# Patient Record
Sex: Male | Born: 1950 | Race: White | Hispanic: No | Marital: Married
Health system: Southern US, Community
[De-identification: ages and names within clinical notes are randomized; demographics above are authoritative.]

---

## 2006-08-27 ENCOUNTER — Ambulatory Visit: Payer: Self-pay | Admitting: Urology

## 2007-10-08 ENCOUNTER — Ambulatory Visit: Payer: Self-pay | Admitting: Internal Medicine

## 2008-02-17 ENCOUNTER — Ambulatory Visit: Payer: Self-pay | Admitting: Internal Medicine

## 2008-04-14 ENCOUNTER — Ambulatory Visit: Payer: Self-pay | Admitting: Psychiatry

## 2009-10-19 ENCOUNTER — Encounter: Admission: RE | Admit: 2009-10-19 | Discharge: 2009-10-19 | Payer: Self-pay | Admitting: Diagnostic Neuroimaging

## 2009-10-25 ENCOUNTER — Encounter: Admission: RE | Admit: 2009-10-25 | Discharge: 2009-11-23 | Payer: Self-pay | Admitting: Diagnostic Neuroimaging

## 2010-08-27 ENCOUNTER — Encounter: Payer: Self-pay | Admitting: Diagnostic Neuroimaging

## 2012-02-21 ENCOUNTER — Emergency Department: Payer: Self-pay | Admitting: Emergency Medicine

## 2012-02-21 LAB — CBC
HCT: 43 %
HGB: 14.1 g/dL
MCH: 30.9 pg
MCHC: 32.7 g/dL
MCV: 94 fL
Platelet: 143 x10 3/mm 3 — ABNORMAL LOW
RBC: 4.56 x10 6/mm 3
RDW: 17.1 % — ABNORMAL HIGH
WBC: 6.2 x10 3/mm 3

## 2012-02-21 LAB — URINALYSIS, COMPLETE
Bacteria: NONE SEEN
Bilirubin,UR: NEGATIVE
Blood: NEGATIVE
Glucose,UR: NEGATIVE mg/dL
Ketone: NEGATIVE
Leukocyte Esterase: NEGATIVE
Nitrite: NEGATIVE
Ph: 5
Protein: NEGATIVE
RBC,UR: 1 /HPF
Specific Gravity: 1.011
Squamous Epithelial: NONE SEEN
WBC UR: NONE SEEN /HPF

## 2012-02-21 LAB — COMPREHENSIVE METABOLIC PANEL
Albumin: 3.9 g/dL (ref 3.4–5.0)
Alkaline Phosphatase: 71 U/L (ref 50–136)
BUN: 33 mg/dL — ABNORMAL HIGH (ref 7–18)
Glucose: 95 mg/dL (ref 65–99)
SGOT(AST): 23 U/L (ref 15–37)
SGPT (ALT): 12 U/L
Total Protein: 8.2 g/dL (ref 6.4–8.2)

## 2012-02-21 LAB — CK TOTAL AND CKMB (NOT AT ARMC)
CK, Total: 134 U/L
CK-MB: 2.7 ng/mL

## 2012-02-22 ENCOUNTER — Emergency Department: Payer: Self-pay | Admitting: *Deleted

## 2012-02-22 LAB — COMPREHENSIVE METABOLIC PANEL
Albumin: 3.3 g/dL — ABNORMAL LOW (ref 3.4–5.0)
Anion Gap: 4 — ABNORMAL LOW (ref 7–16)
Calcium, Total: 8.5 mg/dL (ref 8.5–10.1)
Creatinine: 1.95 mg/dL — ABNORMAL HIGH (ref 0.60–1.30)
EGFR (African American): 42 — ABNORMAL LOW
EGFR (Non-African Amer.): 36 — ABNORMAL LOW
Glucose: 84 mg/dL (ref 65–99)

## 2012-02-22 LAB — CBC
HGB: 13.2 g/dL (ref 13.0–18.0)
RBC: 4.29 10*6/uL — ABNORMAL LOW (ref 4.40–5.90)
WBC: 5.8 10*3/uL (ref 3.8–10.6)

## 2012-12-23 ENCOUNTER — Ambulatory Visit: Payer: Self-pay | Admitting: Family Medicine

## 2012-12-23 LAB — CBC
MCH: 30.4 pg (ref 26.0–34.0)
MCHC: 33.2 g/dL (ref 32.0–36.0)
MCV: 92 fL (ref 80–100)
Platelet: 173 10*3/uL (ref 150–440)

## 2012-12-23 LAB — URINALYSIS, COMPLETE
Bacteria: NEGATIVE
Glucose,UR: NEGATIVE mg/dL (ref 0–75)
Leukocyte Esterase: NEGATIVE
Nitrite: NEGATIVE
Specific Gravity: 1.02 (ref 1.003–1.030)

## 2012-12-23 LAB — COMPREHENSIVE METABOLIC PANEL
Albumin: 3.4 g/dL (ref 3.4–5.0)
BUN: 16 mg/dL (ref 7–18)
Bilirubin,Total: 0.2 mg/dL (ref 0.2–1.0)
Chloride: 98 mmol/L (ref 98–107)
Creatinine: 1.94 mg/dL — ABNORMAL HIGH (ref 0.60–1.30)
EGFR (African American): 42 — ABNORMAL LOW
EGFR (Non-African Amer.): 36 — ABNORMAL LOW
Glucose: 90 mg/dL (ref 65–99)
SGOT(AST): 16 U/L (ref 15–37)

## 2012-12-23 LAB — DRUG SCREEN, URINE
Barbiturates, Ur Screen: NEGATIVE (ref ?–200)
MDMA (Ecstasy)Ur Screen: NEGATIVE (ref ?–500)
Opiate, Ur Screen: NEGATIVE (ref ?–300)
Phencyclidine (PCP) Ur S: NEGATIVE (ref ?–25)

## 2012-12-23 LAB — VALPROIC ACID LEVEL: Valproic Acid: 96 ug/mL

## 2012-12-23 LAB — TSH: Thyroid Stimulating Horm: 3.41 u[IU]/mL

## 2013-01-26 ENCOUNTER — Ambulatory Visit: Payer: Self-pay

## 2013-07-24 ENCOUNTER — Inpatient Hospital Stay: Payer: Self-pay | Admitting: Specialist

## 2013-07-24 LAB — CBC
HCT: 39.6 % — ABNORMAL LOW (ref 40.0–52.0)
MCH: 30.8 pg (ref 26.0–34.0)
MCHC: 34.4 g/dL (ref 32.0–36.0)
MCV: 90 fL (ref 80–100)
Platelet: 186 10*3/uL (ref 150–440)
RBC: 4.42 10*6/uL (ref 4.40–5.90)

## 2013-07-24 LAB — URINALYSIS, COMPLETE
Bilirubin,UR: NEGATIVE
Blood: NEGATIVE
Glucose,UR: NEGATIVE mg/dL (ref 0–75)
Nitrite: NEGATIVE
Ph: 6 (ref 4.5–8.0)
RBC,UR: <1 /HPF (ref 0–5)
Specific Gravity: 1.01 (ref 1.003–1.030)
WBC UR: <1 /HPF (ref 0–5)

## 2013-07-24 LAB — SALICYLATE LEVEL: Salicylates, Serum: 12.1 mg/dL — ABNORMAL HIGH

## 2013-07-24 LAB — DRUG SCREEN, URINE
Amphetamines, Ur Screen: NEGATIVE (ref ?–1000)
Barbiturates, Ur Screen: NEGATIVE (ref ?–200)
Cannabinoid 50 Ng, Ur ~~LOC~~: NEGATIVE (ref ?–50)
Opiate, Ur Screen: NEGATIVE (ref ?–300)
Phencyclidine (PCP) Ur S: NEGATIVE (ref ?–25)
Tricyclic, Ur Screen: NEGATIVE (ref ?–1000)

## 2013-07-24 LAB — COMPREHENSIVE METABOLIC PANEL
Alkaline Phosphatase: 74 U/L
BUN: 20 mg/dL — ABNORMAL HIGH (ref 7–18)
Bilirubin,Total: 0.3 mg/dL (ref 0.2–1.0)
Chloride: 93 mmol/L — ABNORMAL LOW (ref 98–107)
Co2: 26 mmol/L (ref 21–32)
EGFR (Non-African Amer.): 48 — ABNORMAL LOW
SGOT(AST): 23 U/L (ref 15–37)

## 2013-07-24 LAB — ACETAMINOPHEN LEVEL: Acetaminophen: <2 ug/mL

## 2013-07-25 ENCOUNTER — Inpatient Hospital Stay: Payer: Self-pay | Admitting: Psychiatry

## 2013-07-25 LAB — TROPONIN I: Troponin-I: 0.02 ng/mL

## 2013-07-25 LAB — CBC WITH DIFFERENTIAL/PLATELET
Basophil %: 0.5 %
Eosinophil #: 0.2 10*3/uL (ref 0.0–0.7)
Eosinophil %: 2.4 %
HCT: 36.9 % — ABNORMAL LOW (ref 40.0–52.0)
MCH: 29.6 pg (ref 26.0–34.0)
MCHC: 33.1 g/dL (ref 32.0–36.0)
MCV: 89 fL (ref 80–100)
Monocyte %: 14.6 %
Neutrophil %: 49.3 %
RBC: 4.12 10*6/uL — ABNORMAL LOW (ref 4.40–5.90)

## 2013-07-25 LAB — BASIC METABOLIC PANEL
Anion Gap: 4 — ABNORMAL LOW (ref 7–16)
Calcium, Total: 9 mg/dL (ref 8.5–10.1)
Co2: 26 mmol/L (ref 21–32)
Potassium: 4.1 mmol/L (ref 3.5–5.1)
Sodium: 133 mmol/L — ABNORMAL LOW (ref 136–145)

## 2013-09-13 ENCOUNTER — Ambulatory Visit: Payer: Self-pay | Admitting: Physician Assistant

## 2013-12-22 ENCOUNTER — Ambulatory Visit: Payer: Self-pay | Admitting: Internal Medicine

## 2014-09-06 IMAGING — CT CT HEAD WITHOUT CONTRAST
3 of 4 series · 17 of 30 positions shown, 19 images · non-contrast
Comparison: Head CT 02/22/2012.

CLINICAL DATA: Agitation and confusion.

EXAM:
CT HEAD WITHOUT CONTRAST
TECHNIQUE: Contiguous axial images were obtained from the base of the skull
through the vertex without intravenous contrast.

[Series 2: head wo · axial · 0.45mm/px · z∈[-163,-53]mm · 6 of 32 slices shown, 8 images]
[im 5/32  brain]
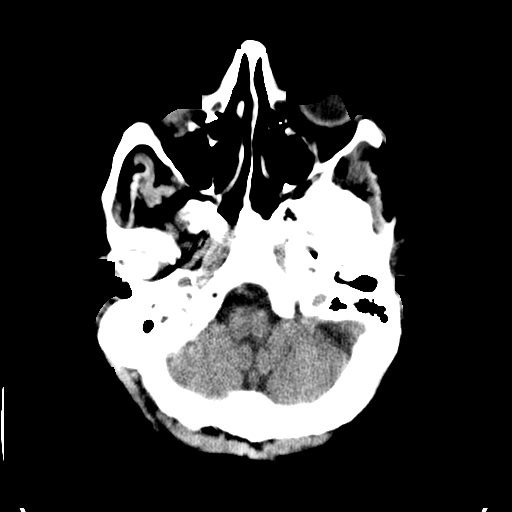
[im 5/32  bone]
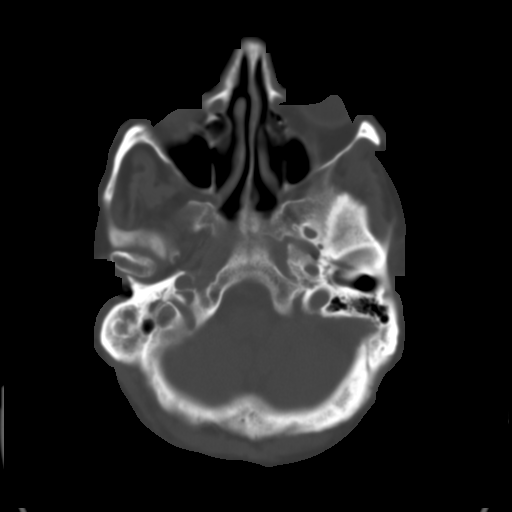
[im 9/32  brain]
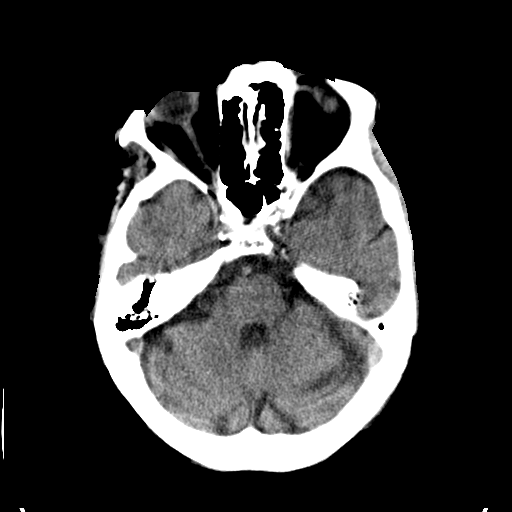
[im 14/32  brain]
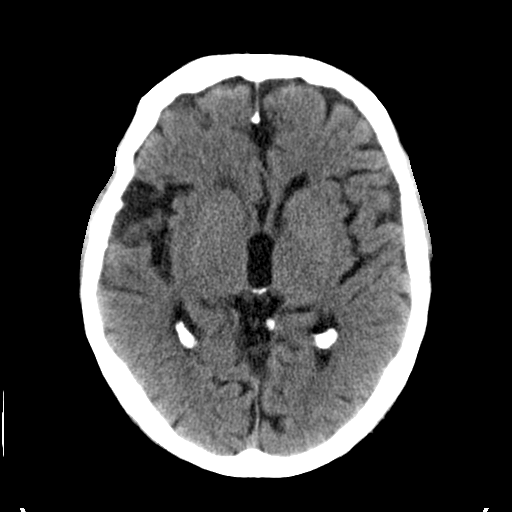
[im 18/32  brain]
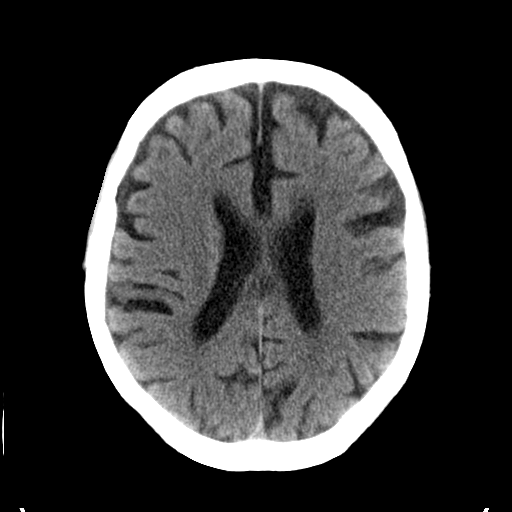
[im 23/32  brain]
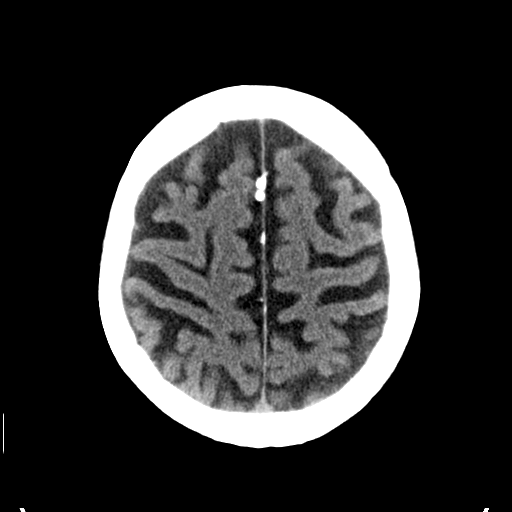
[im 23/32  bone]
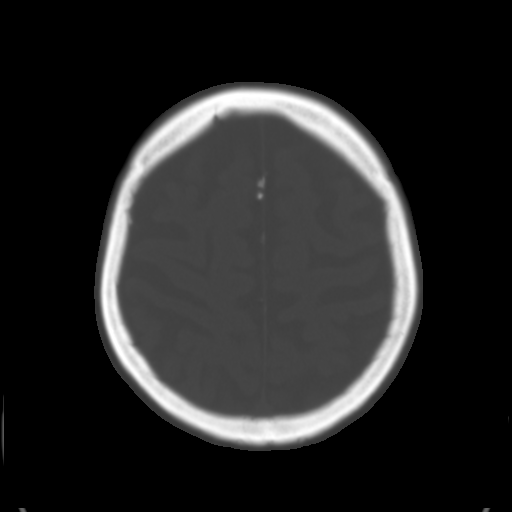
[im 27/32  brain]
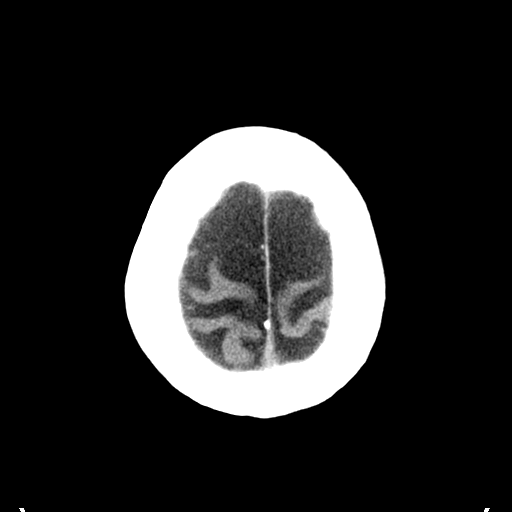

[Series 3: head bone · axial · 0.43mm/px · z∈[-163,-48]mm · 6 of 33 slices shown]
[im 5/33  bone]
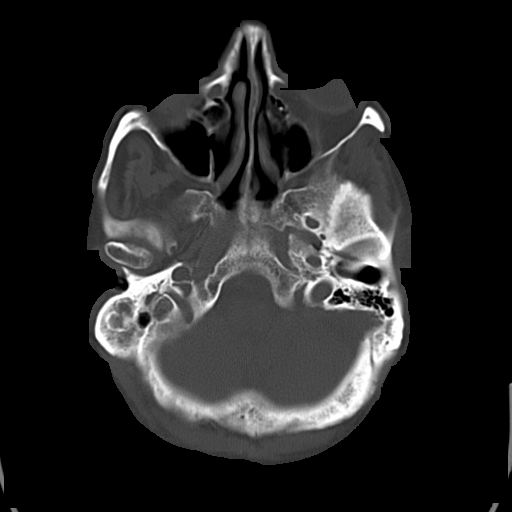
[im 10/33  bone]
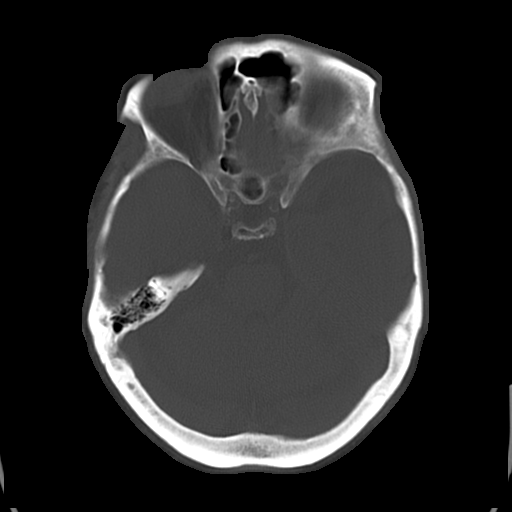
[im 14/33  bone]
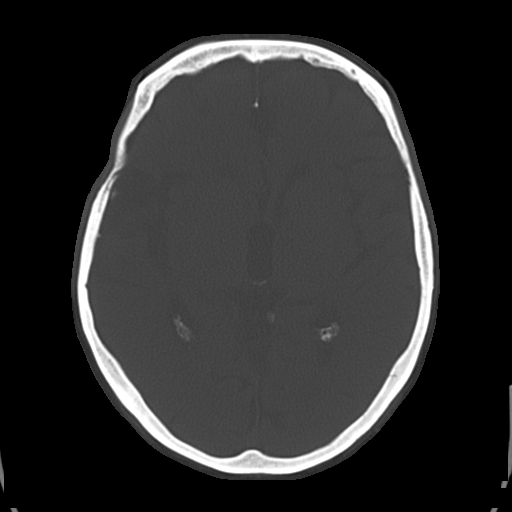
[im 19/33  bone]
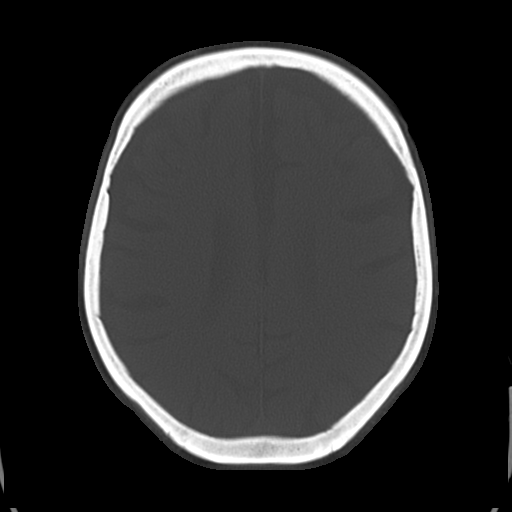
[im 23/33  bone]
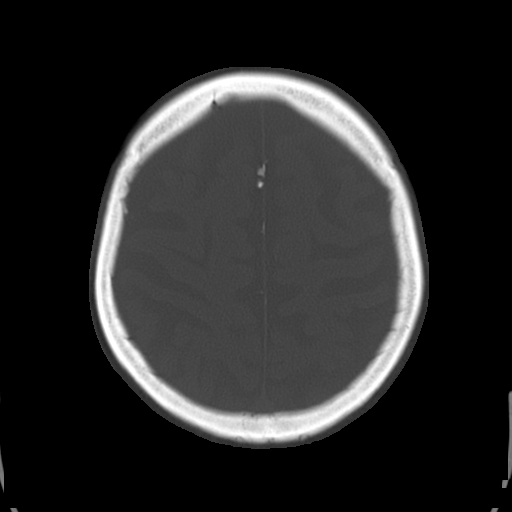
[im 28/33  bone]
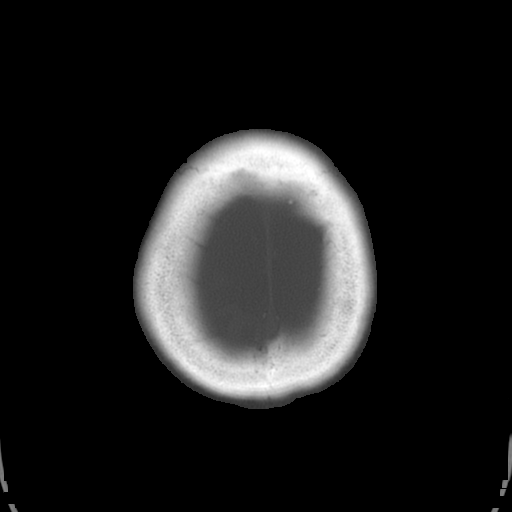

[Series 4: head wo 2 · axial · 0.45mm/px · z∈[-110,-23]mm · 5 of 28 slices shown]
[im 5/28  brain]
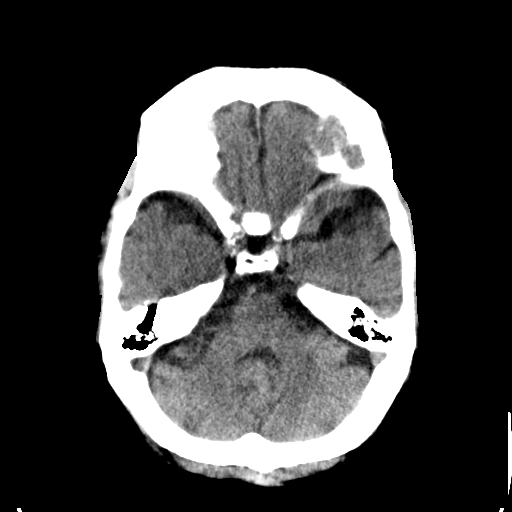
[im 10/28  brain]
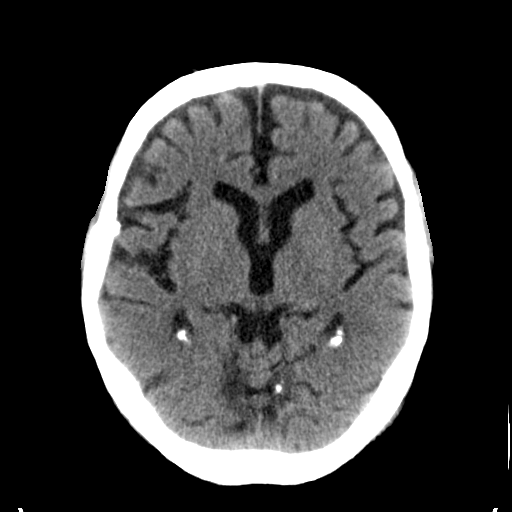
[im 14/28  brain]
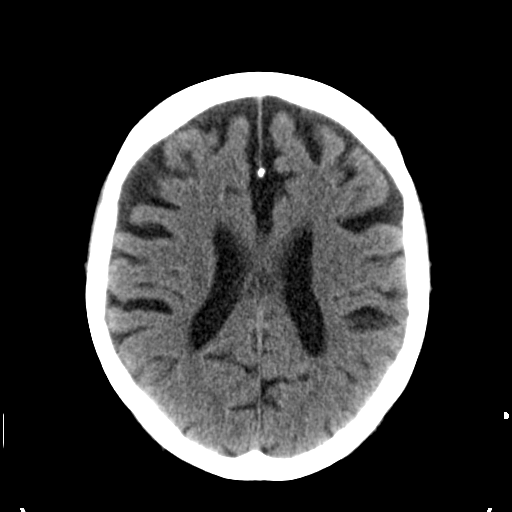
[im 19/28  brain]
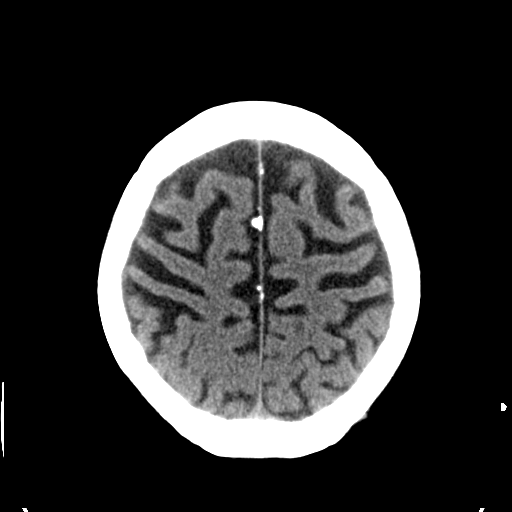
[im 23/28  brain]
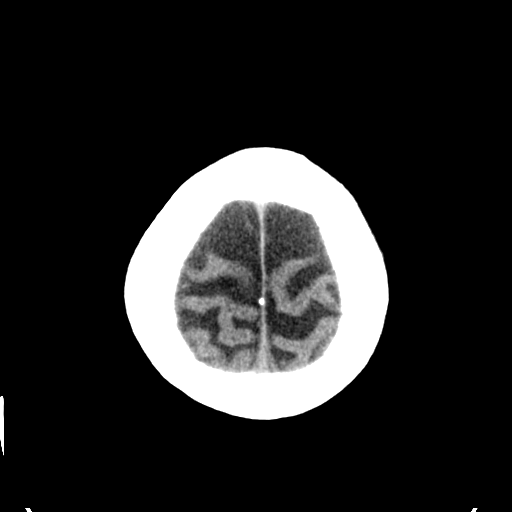

[17 of 30 positions shown; findings below may reference images not displayed]

FINDINGS: Mild cerebral and cerebellar atrophy. No acute intracranial
abnormalities. Specifically, no evidence of acute intracranial
hemorrhage, no definite findings of acute/subacute cerebral
ischemia, no mass, mass effect, hydrocephalus or abnormal intra or
extra-axial fluid collections. Visualized paranasal sinuses and
mastoids are well pneumatized. No acute displaced skull fractures
are identified.
IMPRESSION: 1. No acute intracranial abnormalities.
2. Mild cerebral and cerebellar atrophy.

## 2014-11-26 NOTE — Consult Note (Signed)
Brief Consult Note: Diagnosis: Bipolar disorder mania.   Patient was seen by consultant.   Consult note dictated.   Recommend further assessment or treatment.   Orders entered.   Comments: Please transfer to psychiatry when medically stable.  Electronic Signatures: Kristine LineaPucilowska, Odalys Win (MD)  (Signed 20-Dec-14 11:45)  Authored: Brief Consult Note   Last Updated: 20-Dec-14 11:45 by Kristine LineaPucilowska, Redell Bhandari (MD)
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
## 2014-11-26 NOTE — Consult Note (Signed)
Brief Consult Note: Diagnosis: Bipolar disorder manic with psychosis, AMS from hyponatremia.   Patient was seen by consultant.   Consult note dictated.   Recommend further assessment or treatment.   Orders entered.   Comments: Mr. Gerald Sims has a h/o bipolar [facility] maintained on depakote 100 mg at night. He has been compliant with medications as indicated by therapeutic VPA level. He became increasing ly bizarre and got into an argument with his psychiatrist. In ER Na level 123, likely from Depakote. He is not suicidal or homicidal.  PLAN: 1. We d/c depakote and start Zyprexa zydis 5 mf bid for bipolar mania. Will increase gradually. He takes trazodone for sleep;.  2. Per Dr. Tedra SenegalWiesman he is also taking Nexium 40 mg, Synthroid 75 mcg and Lisinopril 10 mg.   3. Psychoatry will follow up.   2.  Electronic Signatures: Kristine LineaPucilowska, Jolanta (MD)  (Signed 19-Dec-14 15:24)  Authored: Brief Consult Note   Last Updated: 19-Dec-14 15:24 by Kristine LineaPucilowska, Jolanta (MD)

## 2014-11-26 NOTE — Discharge Summary (Signed)
PATIENT NAME:  Gerald Sims, Gerald Sims MR#:  [id number] DATE OF BIRTH:  October 03, 1950  DATE OF ADMISSION:  07/24/2013 DATE OF DISCHARGE:  07/25/2013  For a detailed note, please see the history and physical done on admission by Dr. Winona LegatoVaickute.   DIAGNOSES AT DISCHARGE: 1.  Altered mental status and agitation secondary to underlying bipolar disorder, complicated with mild hyponatremia.  2.  Hyponatremia, likely hypovolemic hyponatremia.  3.  Hypertension.  4.  Benign prostatic hypertrophy.  5.  Bipolar disorder.   DISPOSITION: The patient is being discharged to Behavioral Medicine.   ACTIVITY: As tolerated.   FOLLOWUP: With patient's primary care physician, Dr. Tonia GhentAbby Bender in the next 1 to 2 weeks [facility]   DISCHARGE MEDICATIONS: Depakote extended release 500 mg 2 tabs at bedtime, risperidone 2 mg daily, lisinopril 10 mg daily, Flomax 0.4 mg daily, trazodone 150 mg at bedtime, metoprolol tartrate 50 mg b.i.d., bactroban ointment to be applied t.i.d. as needed.   CONSULTANTS DURING THE HOSPITAL COURSE: Dr. Jennet MaduroPucilowska from psychiatry.   PERTINENT STUDIES DONE DURING THE HOSPITAL COURSE: A CT scan of the head done without contrast on admission, showing no acute intracranial abnormalities, mild cerebral and cerebellar atrophy.   HOSPITAL COURSE: This is a 64 year old male with medical problems as mentioned above, presented to the hospital due to altered mental status and agitation.   1.  Altered mental status/agitation. The most likely cause of this was probably related to his underlying bipolar disorder, and also complicated with mild hyponatremia, as the patient's sodium was 123 on admission. The patient was gently hydrated with IV fluids to help with his hyponatremia. His sodium has since then significantly improved, has normalized, and his mental status is currently at baseline. The patient was already involuntarily committed by psychiatry on admission and  [facility] as he is medically stable.   2.  Hyponatremia. This was acute, mild, hypotonic, hypovolemic hyponatremia. The patient was apparently on two diuretics prior to coming into the hospital, including HCTZ and chlorthalidone. I am discontinuing those for now. He has been hydrated with IV fluids and his sodium has since then improved.   3.  Hypertension. The patient remained hemodynamically stable. As mentioned, the patient was on antihypertensives including chlorthalidone and HCTZ, both of which could contribute to his hyponatremia. I am therefore discontinuing them. At this point, the patient will just continue his metoprolol, as stated.   4.  Benign prostatic hypertrophy. The patient had no evidence of urinary obstruction. The patient will continue his Flomax as stated.    5. Acute renal failure. This was likely secondary to dehydration and poor p.o. intake. The patient's creatinine admission was 1.5. It has now normalized with some IV fluids down to 1.1.  CODE STATUS: The patient is a full code.   DISPOSITION: He is being discharged to Behavioral Medicine.   TIME SPENT: 40 minutes.     ____________________________ Rolly PancakeVivek J. Cherlynn KaiserSainani, MD vjs:cg D: 07/25/2013 14:32:00 ET T: 07/26/2013 00:47:40 ET JOB#: [id number]  cc: Rolly PancakeVivek J. Cherlynn KaiserSainani, MD, <Dictator> Abby D. Hessie DienerBender MD Houston SirenVIVEK J Amauria Younts MD ELECTRONICALLY SIGNED 07/30/2013 13:41
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
## 2014-12-14 NOTE — H&P (Signed)
PATIENT NAME:  Gerald Sims, Gerald Sims [id number] OF BIRTH:  12-09-1950 OF ADMISSION:  12/20/2014OF ASSESSMENT:  07/26/2013 PHYSICIAN:  Hilda LiasVivek Sainani, M.D.PHYSICIAN:  Jolanta B. Jennet MaduroPucilowska, M.D. DATA: Gerald Sims is a 64 year old male with history of psychotic disorder and mood instability.  COMPLAINT: "My wife is coming to take me home."  OF PRESENT ILLNESS: Gerald Sims has been maintained on Depakote by Dr. Barnett AbuWiseman, his primary psychiatrist. The patient showed up at his office on 12/19 very disorganized, restless, confused and tried to convert him to Christianity. He was sent to the hospital. He was briefly hospitalized on medical floor for hyponatremia, likely a contributing factor to confusion. Per his wife?s report, the patient has been behaving strangely in the past several weeks. He was paranoid and easily frightened by noises in the apartment building, putting tape over the door to prevent drafts. Wife did not feel that this was a problem. She also felt that he was more than usually controlling of her behavior. She was not surprised that he was sent to the Emergency Room. The patient, in the past, was treated with an antipsychotic, but recently he has been maintained on Depakote only. In the Emergency Room, he was slightly confused, but at the same time, was able to provide many details of his story. He remembered out names. He was strange, but not delirious. There is no history of alcohol or illicit substance use.  the patient was on medical floor, we discontinue Depakote and initiated Zyprexa zydis. He refused medication at times and was transferred to psychiatry.  PSYCHIATRIC HISTORY: Reportedly, he has never been hospitalized. Dr. Barnett AbuWiseman has been taking care of him for several years, as above. At one point, he was on Risperdal, but this was discontinued as the patient was doing very, very well. There were no suicide attempts that we know of.   MEDICAL HISTORY: Hypertension, hypothyroidism, GERD,  prostate hypertrophy.  No known drug allergies.  ON ADMISSION: Zyprexa zydis 5 mg twice daily, trazodone 150 mg at bedtime, Synthroid 0.1 mg daily, lisinopril 10 mg twice daily, metoprolol 50 mg twice daily, pantoprazole 40 mg daily, Flomax 0.4 mg daily.  HISTORY: He is disabled from mental illness. Lives with his wife in an apartment. He has conflict with the neighbor.   REVIEW OF SYSTEMS: Difficult to obtain. No fevers or chills. No weight changes. No double or blurred vision. No hearing loss. No shortness of breath or cough. The patient was admitted briefly to our hospital at the beginning of October for pneumonia. Positive for chest pain on the right side. No abdominal pain, nausea, vomiting, or diarrhea. No incontinence or frequency. No heat or cold intolerance. No anemia or easy bruising. No acne or rash. No muscle or joint pain. No tingling or weakness. See history of present illness for details.  EXAMINATION: VITAL SIGNS: Blood pressure 115/73, pulse 82, respirations 18, temperature 97. This is a slender male in no acute distress. HEENT: Exophtalmus, The pupils are equal, round, and reactive to light.  Supple. No thyromegaly. Clear to auscultation. No dullness to percussion. Regular rhythm and rate. No murmurs, rubs, or gallops. Soft, nontender, nondistended. Positive bowel sounds. Normal muscle strength in all extremities. No rashes or bruises. No cervical adenopathy. Cranial nerves II through XII are intact.  DATA: Blood glucose 85, BUN 20, creatinine 1.53, sodium 133, potassium 5.1. Blood alcohol level is 0. LFTs within normal limits. TSH 7.73. Depakote level 87. Urine tox screen is negative for substances. CBC within normal limits. Urinalysis is not suggestive  of urinary tract infection. Serum acetaminophen less than 2. Serum salicylates 12.1.  STATUS EXAMINATION ON ADMISSION: The patient is alert and oriented to person, place, time and somewhat to situation. He still remembers his encounter  with Dr. Barnett AbuWiseman and promises not to see him again as he is not a [place]. He wants to go back to Dr. Janeece RiggersSu, who is a good man. He is marginally groomed. He wears a red holiday sweater. He maintains intense eye contact. He is pleasant and polite initially but starts to mumble under his breath when learns that he will not be discharged today. He has a hospital bed and is in [place] position claiming that it helps him sleep. His speech is slightly pressured  difficult to understand at times. Mood is fine with labile affect. Thought process is disorganized at times, but at other times, he is a pretty good historian. He denies thoughts of hurting himself or others. He denies delusions or paranoia. He denies auditory or visual hallucinations but appears to have conversation with the voices. He is preoccupied with religious themes. His cognition is difficult to assess. His insight and judgment are poor.  DIAGNOSES:I:  Bipolar affective disorder mania with psychosis.  Delirium due to hyponatremia resolved.  AXIS II:  Deferred. III:  Hypertension, hypothyroidism, chronic kidney disease. IV:  Mental and physical illness. V:  Global assessment of functioning 35.  The patient was admitted to [facility] Unit for safety, stabilization, and medication management. She was initially placed on suicide precautions and was closely monitored for any unsafe behaviors. She underwent full psychiatric and risk assessment. She received pharmacotherapy, individual and group psychotherapy, substance abuse counseling, and support from therapeutic milieu.   Mood/psychosis: We started Zyprexa on medical floor. We will increase dose to 15 mg at bedtime. He may need addition of mood stabilizer.   Medical: We will continue all other medications as prescribed by his PCP. His blood pressure was quite elevated on admission but is rather low today. We will hold antihypertensives.   3. Disposition:  He will be discharge to home with the wife who is very supportive.   Electronic Signatures: Kristine LineaPucilowska, Jolanta (MD)  (Signed on 21-Dec-14 12:32)  Authored  Last Updated: 21-Dec-14 12:32 by Kristine LineaPucilowska, Jolanta (MD)

## 2015-02-04 IMAGING — CR DG CHEST 2V
1 series · 2 of 2 positions shown · non-contrast
Comparison: 02/17/2008.

CLINICAL DATA: 62-year-old male shortness of Breath. Initial
encounter.

EXAM:
CHEST  2 VIEW

[Series 1: pa · 0.17mm/px · 2 of 2 slices shown]
[im 1/2]
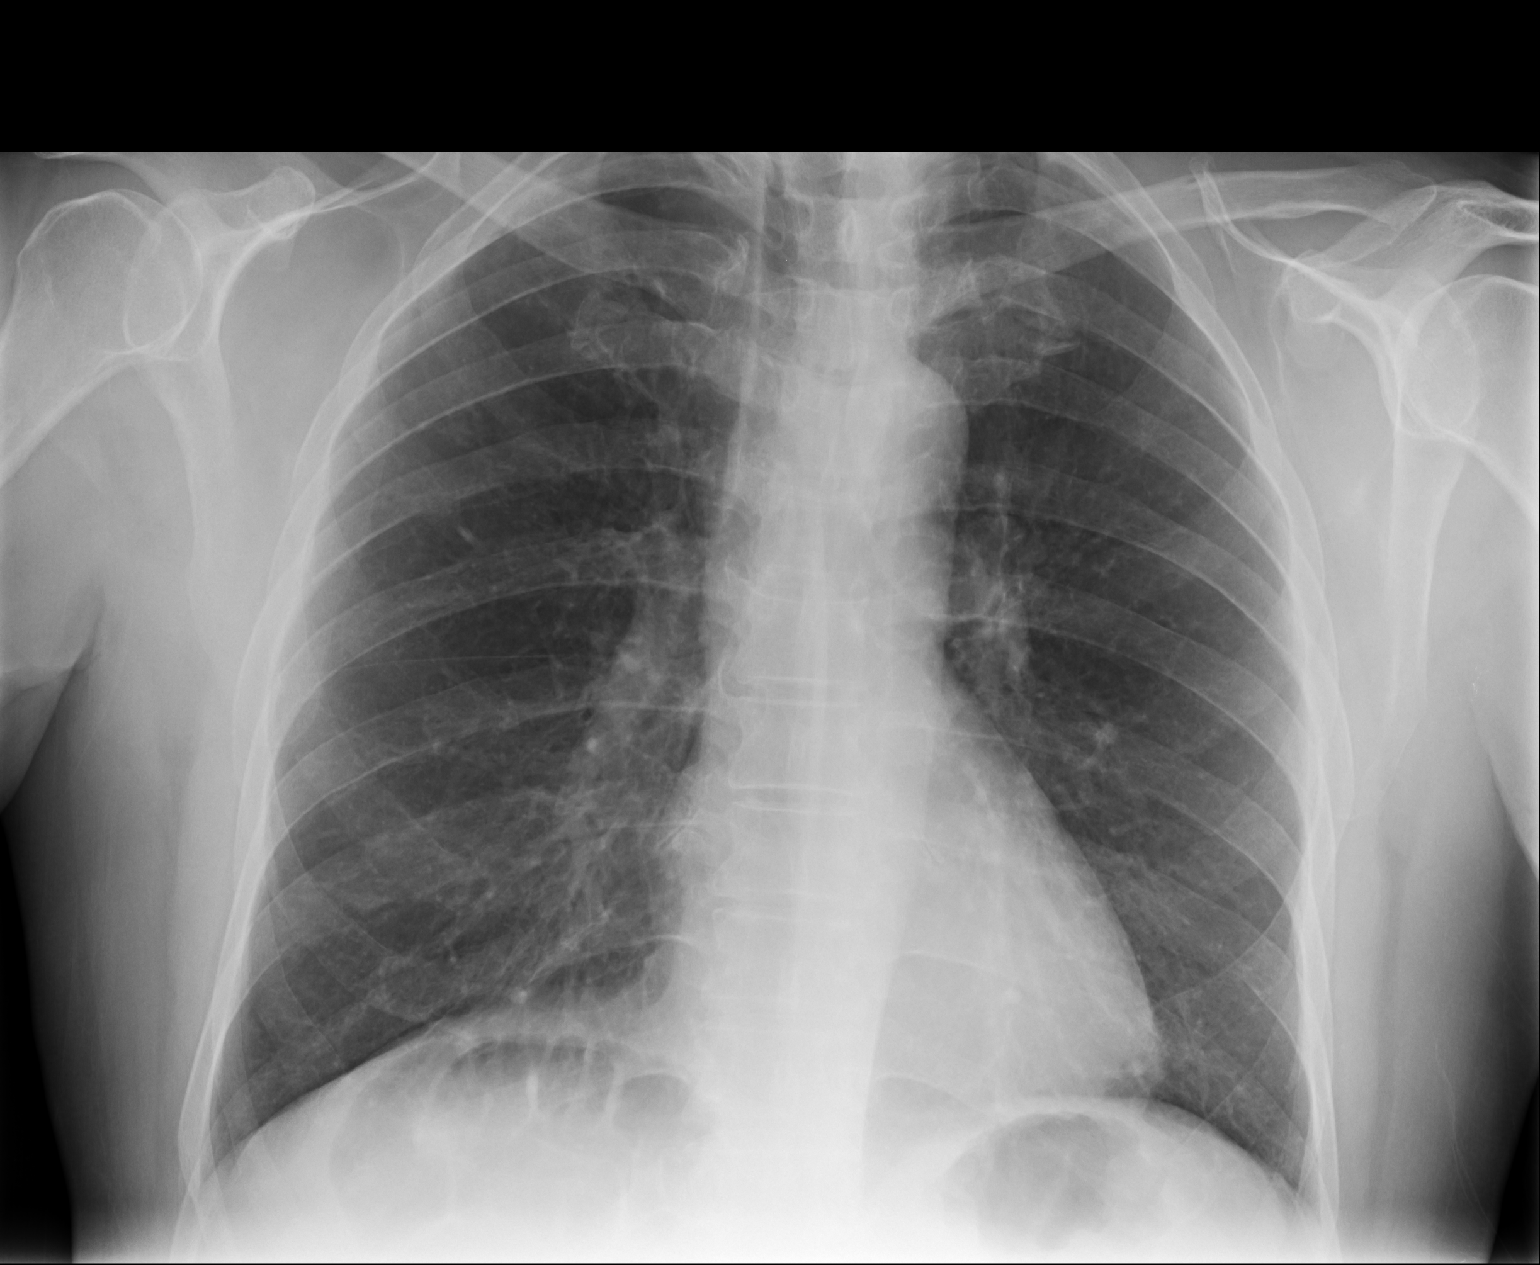
[im 2/2]
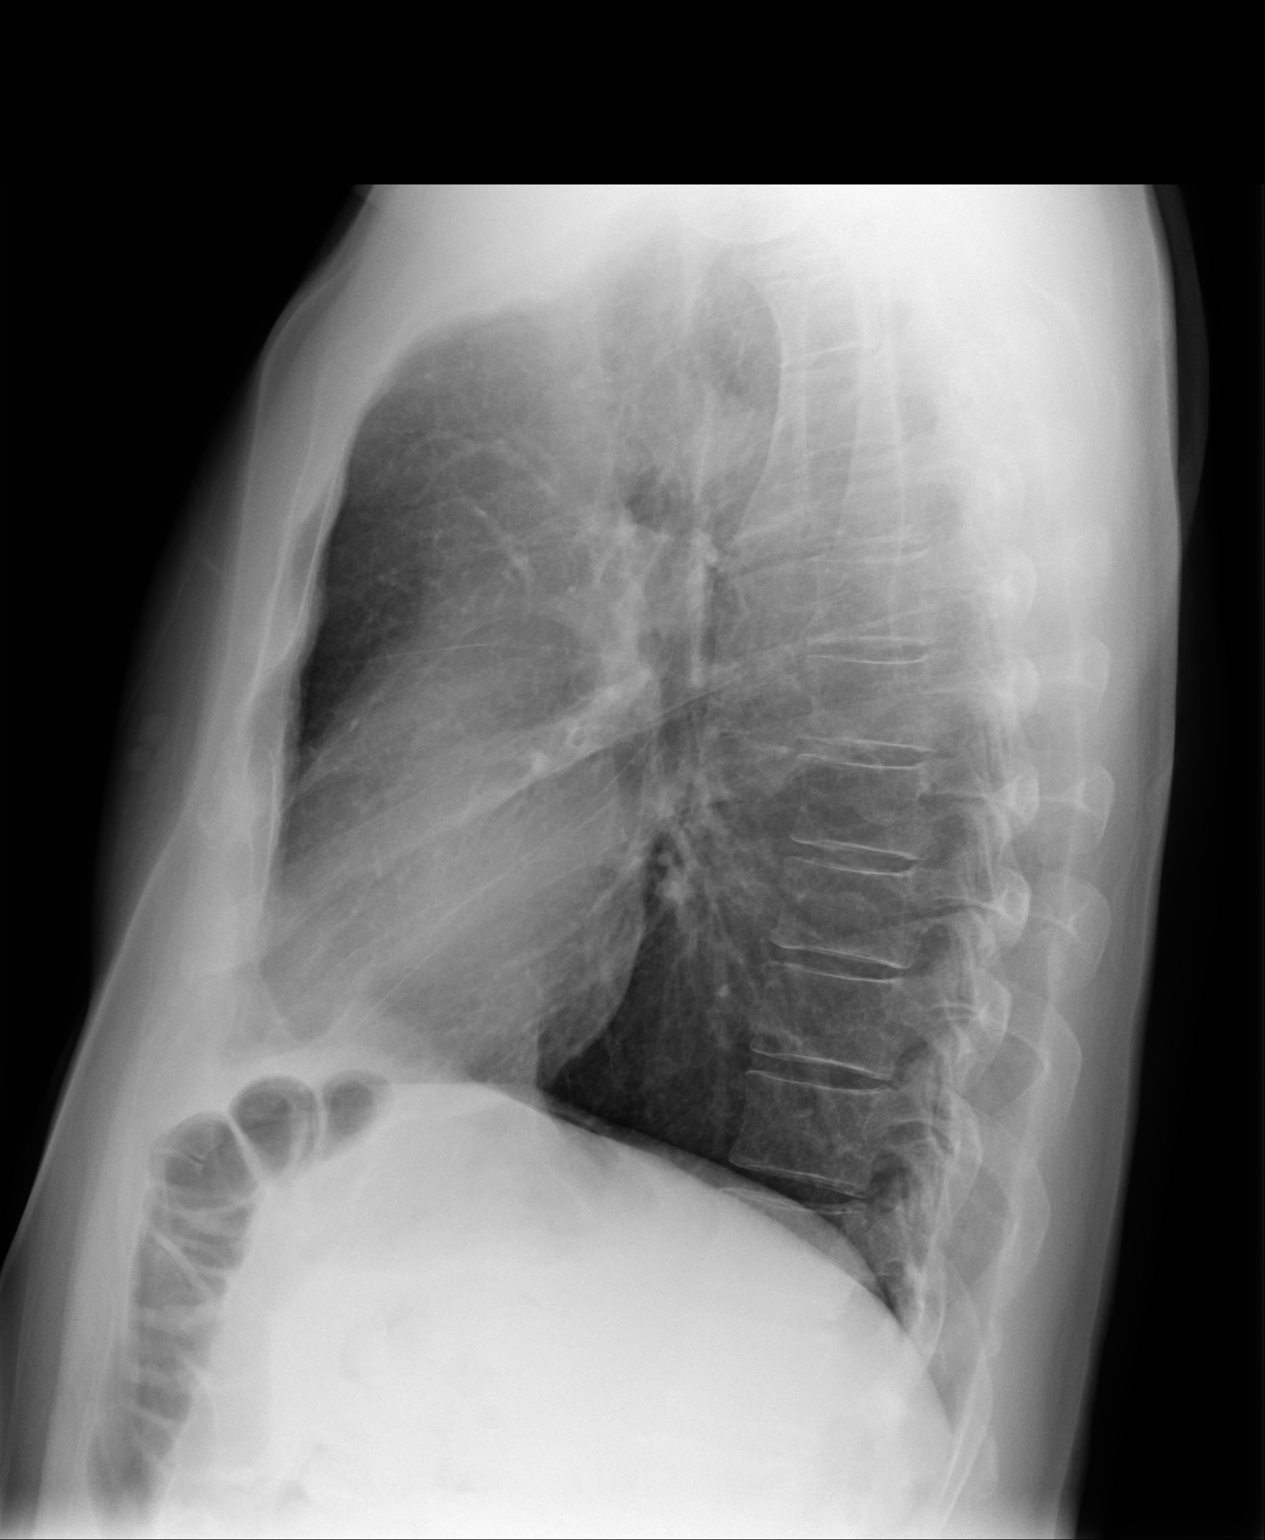

[2 of 2 positions shown; findings below may reference images not displayed]

FINDINGS: Lung volumes at the upper limits of normal, stable. Normal cardiac
size and mediastinal contours. Visualized tracheal air column is
within normal limits. No pneumothorax or pulmonary edema. No pleural
effusion or consolidation. No confluent pulmonary opacity. No acute
osseous abnormality identified.
IMPRESSION: No acute cardiopulmonary abnormality.

## 2021-02-03 DEATH — deceased
